# Patient Record
Sex: Female | Born: 1995 | Race: White | Hispanic: No | Marital: Single | Smoking: Never smoker
Health system: Southern US, Community
[De-identification: ages and names within clinical notes are randomized; demographics above are authoritative.]

## PROBLEM LIST (undated history)

## (undated) DIAGNOSIS — J45909 Unspecified asthma, uncomplicated: Secondary | ICD-10-CM

---
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
## 2016-11-11 ENCOUNTER — Emergency Department (HOSPITAL_COMMUNITY)
Admission: EM | Admit: 2016-11-11 | Discharge: 2016-11-11 | Disposition: A | Discharge status: Home / Self Care | Payer: 59 | Attending: Emergency Medicine | Admitting: Emergency Medicine

## 2016-11-11 ENCOUNTER — Emergency Department (HOSPITAL_COMMUNITY): Payer: 59

## 2016-11-11 ENCOUNTER — Encounter (HOSPITAL_COMMUNITY): Payer: Self-pay | Admitting: Emergency Medicine

## 2016-11-11 DIAGNOSIS — W01190A Fall on same level from slipping, tripping and stumbling with subsequent striking against furniture, initial encounter: Secondary | ICD-10-CM | POA: Insufficient documentation

## 2016-11-11 DIAGNOSIS — S93401A Sprain of unspecified ligament of right ankle, initial encounter: Secondary | ICD-10-CM | POA: Insufficient documentation

## 2016-11-11 DIAGNOSIS — Y999 Unspecified external cause status: Secondary | ICD-10-CM | POA: Insufficient documentation

## 2016-11-11 DIAGNOSIS — Y939 Activity, unspecified: Secondary | ICD-10-CM | POA: Insufficient documentation

## 2016-11-11 DIAGNOSIS — S99911A Unspecified injury of right ankle, initial encounter: Secondary | ICD-10-CM | POA: Diagnosis present

## 2016-11-11 DIAGNOSIS — Z79899 Other long term (current) drug therapy: Secondary | ICD-10-CM | POA: Diagnosis not present

## 2016-11-11 DIAGNOSIS — Y929 Unspecified place or not applicable: Secondary | ICD-10-CM | POA: Diagnosis not present

## 2016-11-11 DIAGNOSIS — J45909 Unspecified asthma, uncomplicated: Secondary | ICD-10-CM | POA: Insufficient documentation

## 2016-11-11 HISTORY — DX: Unspecified asthma, uncomplicated: J45.909

## 2016-11-11 NOTE — Discharge Instructions (Signed)
Elevate and apply ice packs on/off to your ankle.  Ibuprofen every 6 hrs for pain.  Follow-up with the orthopedic doctor listed in one week if not improving
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
## 2016-11-14 NOTE — ED Provider Notes (Signed)
[facility]-EMERGENCY DEPT Provider Note   CSN: [id number] Arrival date & time: 11/11/16  1113     History   Chief Complaint Chief Complaint  Patient presents with  . Ankle Pain    HPI Carly Pugh is a 21 y.o. female.  HPI   Carly Pugh is a 21 y.o. female who presents to the Emergency Department complaining of right ankle pain after a mechanical fall that occurred earlier on the day of arrival.  She reports pain and swelling of the ankle and pain with walking and weight bearing.  She denies other injuries, numbness, or open wounds.     Past Medical History:  Diagnosis Date  . Asthma     There are no active problems to display for this patient.   No past surgical history on file.  OB History    No data available       Home Medications    Prior to Admission medications   Medication Sig Start Date End Date Taking? Authorizing Provider  acetaminophen (TYLENOL) 325 MG tablet Take 650 mg by mouth every 6 (six) hours as needed for moderate pain.   Yes Historical Provider, MD  albuterol (PROVENTIL HFA;VENTOLIN HFA) 108 (90 Base) MCG/ACT inhaler Inhale 1-2 puffs into the lungs every 6 (six) hours as needed for wheezing or shortness of breath.   Yes Historical Provider, MD    Family History No family history on file.  Social History Social History  Substance Use Topics  . Smoking status: Never Smoker  . Smokeless tobacco: Not on file  . Alcohol use No     Allergies   Patient has no known allergies.   Review of Systems Review of Systems  Constitutional: Negative for chills and fever.  Respiratory: Negative for chest tightness and shortness of breath.   Cardiovascular: Negative for chest pain.  Musculoskeletal: Positive for arthralgias and joint swelling.  Skin: Negative for color change and wound.  Neurological: Negative for dizziness, weakness and numbness.  All other systems reviewed and are negative.    Physical Exam Updated Vital Signs BP 121/69    Pulse 74   Temp 98.9 F (37.2 C) (Oral)   Resp 16   Ht 5\' 4"  (1.626 m)   Wt 61.2 kg   LMP 10/21/2016 (Exact Date)   SpO2 100%   BMI 23.17 kg/m   Physical Exam  Constitutional: She is oriented to person, place, and time. She appears well-developed and well-nourished. No distress.  HENT:  Head: Normocephalic and atraumatic.  Cardiovascular: Normal rate, regular rhythm, normal heart sounds and intact distal pulses.   Pulmonary/Chest: Effort normal and breath sounds normal.  Musculoskeletal: She exhibits tenderness. She exhibits no edema.  ttp of the lateral right ankle.  Mild to moderate edema.  DP pulse is brisk,distal sensation intact.  No erythema, abrasion, bruising or bony deformity.  No proximal tenderness.  Neurological: She is alert and oriented to person, place, and time. She exhibits normal muscle tone. Coordination normal.  Skin: Skin is warm and dry. No erythema.  Psychiatric: She has a normal mood and affect.  Nursing note and vitals reviewed.    ED Treatments / Results  Labs (all labs ordered are listed, but only abnormal results are displayed) Labs Reviewed - No data to display  EKG  EKG Interpretation None       Radiology Dg Ankle Complete Right  Result Date: 11/11/2016 CLINICAL DATA:  Pain following fall EXAM: RIGHT ANKLE - COMPLETE 3+ VIEW COMPARISON:  May 24, 2012 FINDINGS: Frontal, oblique, and lateral views were obtained. There is no fracture or joint effusion. The ankle mortise appears intact. No appreciable arthropathy. IMPRESSION: No fracture or evident arthropathy.  Ankle mortise appears intact. Electronically Signed   By: Bretta BangWilliam  Woodruff III M.D.   On: 11/11/2016 12:54     Procedures Procedures (including critical care time)  Medications Ordered in ED Medications - No data to display   Initial Impression / Assessment and Plan / ED Course  I have reviewed the triage vital signs and the nursing notes.  Pertinent labs & imaging results  that were available during my care of the patient were reviewed by me and considered in my medical decision making (see chart for details).    Right ankle sprain.  NV intact.  ASO applied.  Agrees to elevate,  RICE therapy, orthopedic referral info given.    Final Clinical Impressions(s) / ED Diagnoses   Final diagnoses:  Sprain of right ankle, unspecified ligament, initial encounter    New Prescriptions Discharge Medication List as of 11/11/2016  1:59 PM       Kearstyn Avitia Trisha Mangleriplett, PA-C 11/14/16 2325    Vanetta MuldersScott Zackowski, MD 11/18/16 1752

## 2017-12-25 ENCOUNTER — Emergency Department (HOSPITAL_BASED_OUTPATIENT_CLINIC_OR_DEPARTMENT_OTHER): Payer: 59

## 2017-12-25 ENCOUNTER — Emergency Department (HOSPITAL_BASED_OUTPATIENT_CLINIC_OR_DEPARTMENT_OTHER)
Admission: EM | Admit: 2017-12-25 | Discharge: 2017-12-25 | Disposition: A | Discharge status: Home / Self Care | Payer: 59 | Attending: Emergency Medicine | Admitting: Emergency Medicine

## 2017-12-25 ENCOUNTER — Other Ambulatory Visit: Payer: Self-pay

## 2017-12-25 ENCOUNTER — Encounter (HOSPITAL_BASED_OUTPATIENT_CLINIC_OR_DEPARTMENT_OTHER): Payer: Self-pay | Admitting: Emergency Medicine

## 2017-12-25 DIAGNOSIS — J45909 Unspecified asthma, uncomplicated: Secondary | ICD-10-CM | POA: Insufficient documentation

## 2017-12-25 DIAGNOSIS — Y998 Other external cause status: Secondary | ICD-10-CM | POA: Diagnosis not present

## 2017-12-25 DIAGNOSIS — Y9389 Activity, other specified: Secondary | ICD-10-CM | POA: Diagnosis not present

## 2017-12-25 DIAGNOSIS — M79641 Pain in right hand: Secondary | ICD-10-CM | POA: Insufficient documentation

## 2017-12-25 DIAGNOSIS — Y92410 Unspecified street and highway as the place of occurrence of the external cause: Secondary | ICD-10-CM | POA: Diagnosis not present

## 2017-12-25 MED ORDER — METHOCARBAMOL 500 MG PO TABS: 500.0000 mg | ORAL_TABLET | Freq: Four times a day (QID) | ORAL | 0 refills | Status: AC | PRN

## 2017-12-25 NOTE — ED Notes (Signed)
NAD at this time. Pt is stable and going home.  
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
## 2017-12-25 NOTE — Discharge Instructions (Signed)
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
